# Patient Record
Sex: Female | Born: 1975 | Race: White | Hispanic: No | Marital: Married | Smoking: Never smoker
Health system: Southern US, Community
[De-identification: ages and names within clinical notes are randomized; demographics above are authoritative.]

---

## 2018-01-16 ENCOUNTER — Encounter (HOSPITAL_BASED_OUTPATIENT_CLINIC_OR_DEPARTMENT_OTHER): Payer: Self-pay | Admitting: *Deleted

## 2018-01-16 ENCOUNTER — Emergency Department (HOSPITAL_BASED_OUTPATIENT_CLINIC_OR_DEPARTMENT_OTHER)
Admission: EM | Admit: 2018-01-16 | Discharge: 2018-01-16 | Disposition: A | Discharge status: Home / Self Care | Payer: BC Managed Care – PPO | Attending: Emergency Medicine | Admitting: Emergency Medicine

## 2018-01-16 ENCOUNTER — Other Ambulatory Visit: Payer: Self-pay

## 2018-01-16 ENCOUNTER — Emergency Department (HOSPITAL_BASED_OUTPATIENT_CLINIC_OR_DEPARTMENT_OTHER): Payer: BC Managed Care – PPO

## 2018-01-16 DIAGNOSIS — R079 Chest pain, unspecified: Secondary | ICD-10-CM | POA: Diagnosis present

## 2018-01-16 DIAGNOSIS — F419 Anxiety disorder, unspecified: Secondary | ICD-10-CM

## 2018-01-16 LAB — I-STAT CHEM 8, ED
BUN: 12 mg/dL (ref 6–20)
CALCIUM ION: 1.19 mmol/L (ref 1.15–1.40)
CREATININE: 0.7 mg/dL (ref 0.44–1.00)
Chloride: 103 mmol/L (ref 101–111)
GLUCOSE: 87 mg/dL (ref 65–99)
HCT: 41 % (ref 36.0–46.0)
Hemoglobin: 13.9 g/dL (ref 12.0–15.0)
Potassium: 4 mmol/L (ref 3.5–5.1)
Sodium: 141 mmol/L (ref 135–145)
TCO2: 28 mmol/L (ref 22–32)

## 2018-01-16 LAB — TROPONIN I: Troponin I: <0.03 ng/mL (ref <0.03)

## 2018-01-16 NOTE — ED Provider Notes (Signed)
[facility] EMERGENCY DEPARTMENT Provider Note   CSN: [id number] Arrival date & time: 01/16/18  1238     History   Chief Complaint Chief Complaint  Patient presents with  . Chest Pain    HPI Katie Thornton is a 42 y.o. female.  Patient is a 42 year old female presenting with intermittent chest pain for the past 2 weeks.  PMH significant for anxiety and allergies.  Patient endorsing substernal chest pain with sharp characteristic, 10/10 in severity which seems to be triggered by which she believes is her emotional response.  Pain does not radiate.  She denies associated diaphoresis or shortness of breath with chest pain.  Pain is not worse with exertion and not alleviated with rest.  She has not tried any medications.  This is been an ongoing problem intermittently for the last 2 weeks.  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  She has endorses prior history of similar chest pain in the past during her wedding day.  She does not take any chronic medications.  She denies family coronary artery disease or hypertension.  She has never smoker and denies alcohol or illicit drug use.       History reviewed. No pertinent past medical history.  There are no active problems to display for this patient.   History reviewed. No pertinent surgical history.  OB History    No data available       Home Medications    Prior to Admission medications   Not on File    Family History History reviewed. No pertinent family history.  Social History Social History   Tobacco Use  . Smoking status: Never Smoker  . Smokeless tobacco: Never Used  Substance Use Topics  . Alcohol use: No    Frequency: Never  . Drug use: No     Allergies   Patient has no known allergies.   Review of Systems Review of Systems  Constitutional: Negative for chills and fever.  HENT: Negative for ear pain and sore throat.     Eyes: Negative for pain and visual disturbance.  Respiratory: Positive for chest tightness. Negative for cough and shortness of breath.   Cardiovascular: Negative for chest pain and palpitations.  Gastrointestinal: Negative for abdominal pain, diarrhea, nausea and vomiting.  Genitourinary: Negative for dysuria and hematuria.  Musculoskeletal: Negative for arthralgias and back pain.  Skin: Negative for color change and rash.  Neurological: Negative for seizures and syncope.  All other systems reviewed and are negative.    Physical Exam Updated Vital Signs BP 129/87   Pulse 80   Temp 98.2 F (36.8 C) (Oral)   Resp 16   Ht 5\' 4"  (1.626 m)   Wt 94.3 kg (208 lb)   LMP 01/16/2018   SpO2 100%   BMI 35.70 kg/m   Physical Exam  Constitutional: She is oriented to person, place, and time. She appears well-developed and well-nourished.  Non-toxic appearance. She does not appear ill. No distress.  HENT:  Head: Normocephalic and atraumatic.  Eyes: Conjunctivae are normal.  Neck: Neck supple.  Cardiovascular: Normal rate and regular rhythm. Exam reveals no gallop.  No murmur heard.  No systolic murmur is present.  No diastolic murmur is present. Pulmonary/Chest: Effort normal and breath sounds normal. No respiratory distress. She has no wheezes. She has no rhonchi. She has no rales.  Abdominal: Soft. There is no tenderness.  Musculoskeletal: She exhibits  no edema.       Right lower leg: She exhibits no edema.       Left lower leg: She exhibits no edema.  Neurological: She is alert and oriented to person, place, and time.  Skin: Skin is warm and dry. Capillary refill takes less than 2 seconds.  Psychiatric: Her mood appears anxious.  Nursing note and vitals reviewed.    ED Treatments / Results  Labs (all labs ordered are listed, but only abnormal results are displayed) Labs Reviewed  TROPONIN I  I-STAT CHEM 8, ED    EKG  EKG Interpretation  Date/Time:  Monday January 16 2018 12:58:47 EST Ventricular Rate:  81 PR Interval:  134 QRS Duration: 100 QT Interval:  386 QTC Calculation: 448 R Axis:   57 Text Interpretation:  Normal sinus rhythm Normal ECG No significant change was found Confirmed by Azalia Bilisampos, Kevin ([id number]) on 01/16/2018 1:37:25 PM       Radiology Dg Chest 2 View  Result Date: 01/16/2018 CLINICAL DATA:  Chest pain for 2 weeks. EXAM: CHEST  2 VIEW COMPARISON:  None. FINDINGS: Lungs are clear. Heart size is normal. No pneumothorax or pleural fluid. No bony abnormality. IMPRESSION: Negative chest. Electronically Signed   By: Drusilla Kannerhomas  Dalessio M.D.   On: 01/16/2018 14:04    Procedures Procedures (including critical care time)  Medications Ordered in ED Medications - No data to display   Initial Impression / Assessment and Plan / ED Course  I have reviewed the triage vital signs and the nursing notes.  Pertinent labs & imaging results that were available during my care of the patient were reviewed by me and considered in my medical decision making (see chart for details).    Patient is a 42 year old female presenting with intermittent chest pain for the past 2 weeks.  PMH significant for anxiety and allergies.  Patient presenting with intermittent substernal chest pain for the past 2 weeks.  Pain without associated diaphoresis or shortness of breath.  Atypical given lack of exacerbation with exertion or alleviation with rest.  Does seem to have connection with increased stress and anxiety attacks per patient report.  EKG NSR without ST changes or T wave abnormalities on presentation.  Patient is currently without chest pain.  No tenderness to chest wall.  Lungs clear to auscultation bilaterally.  Patient does appear anxious on exam.  Cardiac exam with regular rate and rhythm without murmurs rubs or gallops.  Will check a troponin and hemoglobin.  Troponin negative with unremarkable Chem-8.  CXR without acute cardiopulmonary processes.  She continues  to remain without chest pain or shortness of breath.  Vital signs continue to remain stable.  Discussed with patient at length about anxiety and recommended she follow-up with her PCP for problem focused visit.  Patient may benefit from psychotherapy and/or pharmacotherapy.  Patient in agreement without further questions.  Final Clinical Impressions(s) / ED Diagnoses   Final diagnoses:  Anxiety    ED Discharge Orders    None       Wendee BeaversMcMullen, Chandler Stofer J, DO 01/16/18 1434    Azalia Bilisampos, Kevin, MD 01/16/18 [phone]

## 2018-01-16 NOTE — Discharge Instructions (Signed)
Your chest pain seems to be related to your stress and anxiety.  Follow-up with your primary care physician for anxiety management.  I would encourage you to look into seeing a therapist to discuss your symptoms and find ways to manage her stress.  You may also benefit from medication.

## 2018-01-16 NOTE — ED Notes (Signed)
Pt verbalizes understanding of d/c instructions and denies any further needs at this time. 

## 2018-01-16 NOTE — ED Triage Notes (Signed)
Pt states CP with increased stress at work x 3 weeks , only while at work. Denies SOB or nausea

## 2019-06-20 IMAGING — CR DG CHEST 2V
2 series · 2 of 2 positions shown · non-contrast
Comparison: None.

CLINICAL DATA: Chest pain for 2 weeks.

EXAM:
CHEST  2 VIEW

[w chest pa]
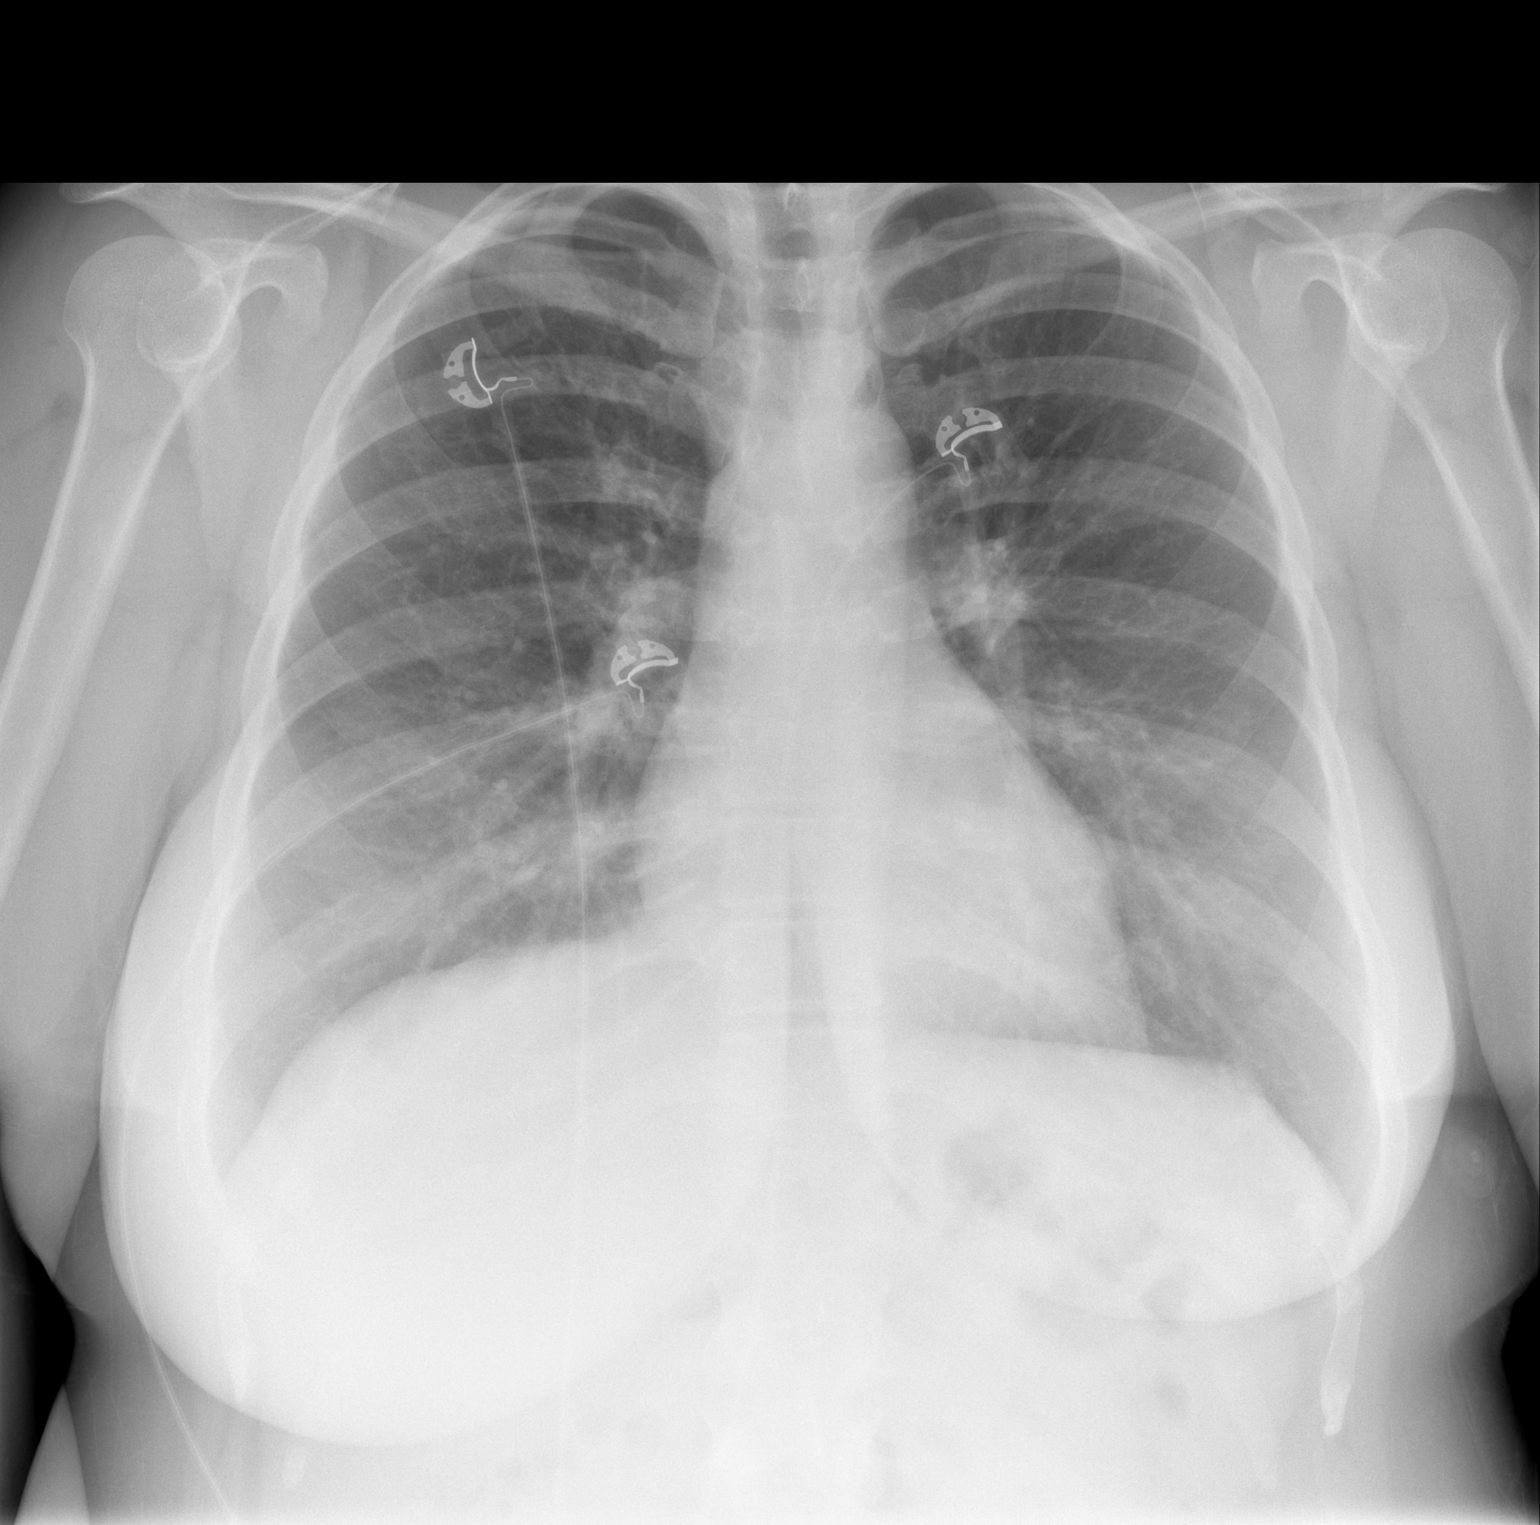

[w chest lat]
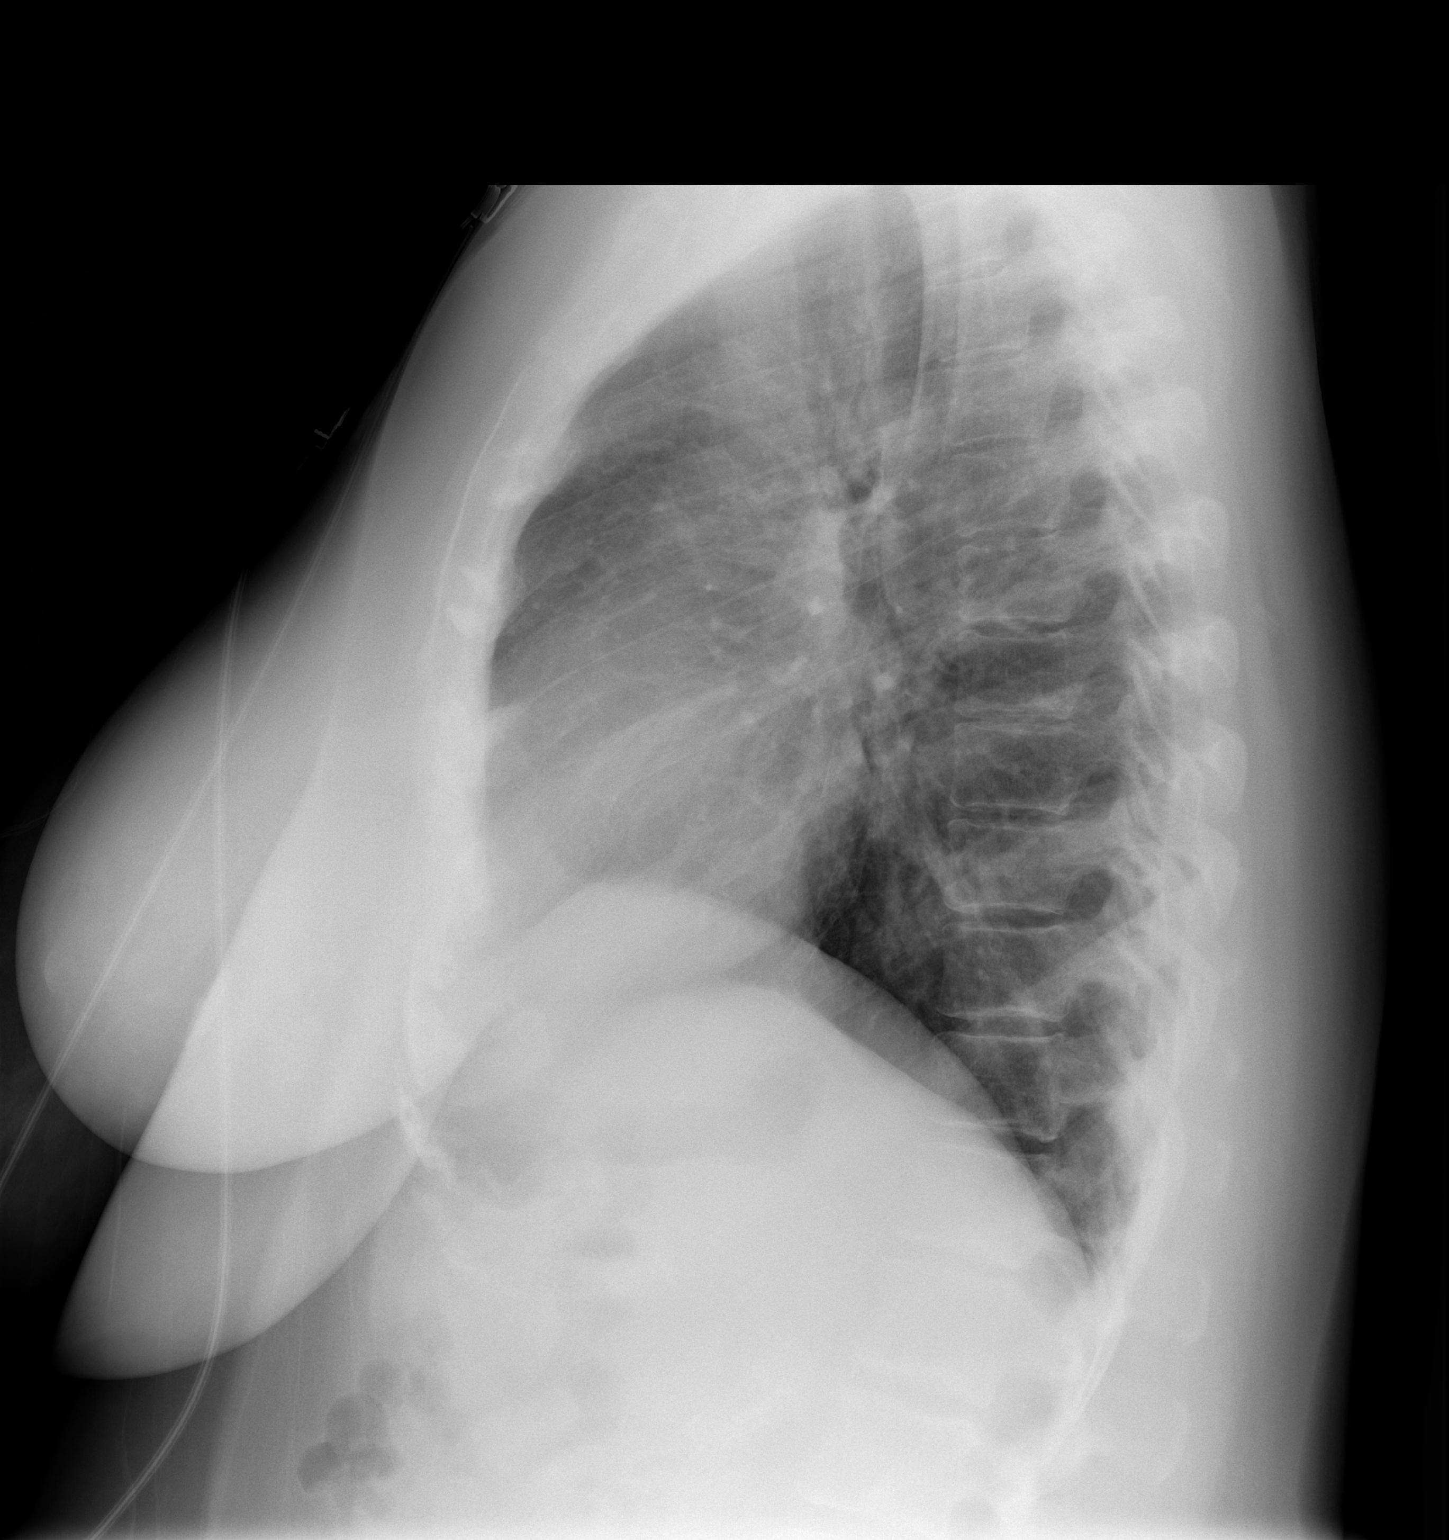

[2 of 2 positions shown; findings below may reference images not displayed]

FINDINGS: Lungs are clear. Heart size is normal. No pneumothorax or pleural
fluid. No bony abnormality.
IMPRESSION: Negative chest.
